# Patient Record
Sex: Female | Born: 1940 | Race: Black or African American | Hispanic: No | Smoking: Never smoker
Health system: Southern US, Community
[De-identification: ages and names within clinical notes are randomized; demographics above are authoritative.]

## PROBLEM LIST (undated history)

## (undated) DIAGNOSIS — M1990 Unspecified osteoarthritis, unspecified site: Secondary | ICD-10-CM

---

## 2020-05-14 ENCOUNTER — Encounter (HOSPITAL_COMMUNITY): Payer: Self-pay | Admitting: Emergency Medicine

## 2020-05-14 ENCOUNTER — Observation Stay (HOSPITAL_COMMUNITY)
Admission: EM | Admit: 2020-05-14 | Discharge: 2020-05-15 | Disposition: A | Discharge status: Home / Self Care | Payer: Medicare Other | Attending: Internal Medicine | Admitting: Internal Medicine

## 2020-05-14 ENCOUNTER — Other Ambulatory Visit: Payer: Self-pay

## 2020-05-14 ENCOUNTER — Emergency Department (HOSPITAL_COMMUNITY): Payer: Medicare Other

## 2020-05-14 DIAGNOSIS — N1831 Chronic kidney disease, stage 3a: Secondary | ICD-10-CM | POA: Diagnosis not present

## 2020-05-14 DIAGNOSIS — I5032 Chronic diastolic (congestive) heart failure: Secondary | ICD-10-CM | POA: Diagnosis not present

## 2020-05-14 DIAGNOSIS — F419 Anxiety disorder, unspecified: Secondary | ICD-10-CM

## 2020-05-14 DIAGNOSIS — Z7982 Long term (current) use of aspirin: Secondary | ICD-10-CM | POA: Diagnosis not present

## 2020-05-14 DIAGNOSIS — R0789 Other chest pain: Secondary | ICD-10-CM | POA: Diagnosis not present

## 2020-05-14 DIAGNOSIS — Z20822 Contact with and (suspected) exposure to covid-19: Secondary | ICD-10-CM | POA: Diagnosis not present

## 2020-05-14 DIAGNOSIS — R9431 Abnormal electrocardiogram [ECG] [EKG]: Secondary | ICD-10-CM

## 2020-05-14 DIAGNOSIS — R2242 Localized swelling, mass and lump, left lower limb: Secondary | ICD-10-CM | POA: Insufficient documentation

## 2020-05-14 DIAGNOSIS — Z79899 Other long term (current) drug therapy: Secondary | ICD-10-CM | POA: Diagnosis not present

## 2020-05-14 DIAGNOSIS — N179 Acute kidney failure, unspecified: Secondary | ICD-10-CM

## 2020-05-14 DIAGNOSIS — I130 Hypertensive heart and chronic kidney disease with heart failure and stage 1 through stage 4 chronic kidney disease, or unspecified chronic kidney disease: Secondary | ICD-10-CM | POA: Insufficient documentation

## 2020-05-14 DIAGNOSIS — F32A Depression, unspecified: Secondary | ICD-10-CM

## 2020-05-14 DIAGNOSIS — I214 Non-ST elevation (NSTEMI) myocardial infarction: Secondary | ICD-10-CM | POA: Diagnosis not present

## 2020-05-14 DIAGNOSIS — E1169 Type 2 diabetes mellitus with other specified complication: Secondary | ICD-10-CM

## 2020-05-14 DIAGNOSIS — Z853 Personal history of malignant neoplasm of breast: Secondary | ICD-10-CM

## 2020-05-14 DIAGNOSIS — E119 Type 2 diabetes mellitus without complications: Secondary | ICD-10-CM | POA: Insufficient documentation

## 2020-05-14 DIAGNOSIS — M7989 Other specified soft tissue disorders: Secondary | ICD-10-CM

## 2020-05-14 DIAGNOSIS — R7989 Other specified abnormal findings of blood chemistry: Secondary | ICD-10-CM

## 2020-05-14 DIAGNOSIS — J680 Bronchitis and pneumonitis due to chemicals, gases, fumes and vapors: Secondary | ICD-10-CM

## 2020-05-14 DIAGNOSIS — J4520 Mild intermittent asthma, uncomplicated: Secondary | ICD-10-CM

## 2020-05-14 DIAGNOSIS — F5104 Psychophysiologic insomnia: Secondary | ICD-10-CM

## 2020-05-14 DIAGNOSIS — E785 Hyperlipidemia, unspecified: Secondary | ICD-10-CM

## 2020-05-14 DIAGNOSIS — IMO0002 Reserved for concepts with insufficient information to code with codable children: Secondary | ICD-10-CM

## 2020-05-14 DIAGNOSIS — J45909 Unspecified asthma, uncomplicated: Secondary | ICD-10-CM

## 2020-05-14 DIAGNOSIS — I429 Cardiomyopathy, unspecified: Secondary | ICD-10-CM

## 2020-05-14 DIAGNOSIS — I10 Essential (primary) hypertension: Secondary | ICD-10-CM

## 2020-05-14 DIAGNOSIS — R0902 Hypoxemia: Secondary | ICD-10-CM

## 2020-05-14 DIAGNOSIS — R0602 Shortness of breath: Secondary | ICD-10-CM | POA: Diagnosis present

## 2020-05-14 DIAGNOSIS — R778 Other specified abnormalities of plasma proteins: Secondary | ICD-10-CM

## 2020-05-14 DIAGNOSIS — G4733 Obstructive sleep apnea (adult) (pediatric): Secondary | ICD-10-CM

## 2020-05-14 HISTORY — DX: Unspecified osteoarthritis, unspecified site: M19.90

## 2020-05-14 LAB — COMPREHENSIVE METABOLIC PANEL
ALT: 20 U/L (ref 0–44)
AST: 23 U/L (ref 15–41)
Albumin: 3.9 g/dL (ref 3.5–5.0)
Alkaline Phosphatase: 50 U/L (ref 38–126)
Anion gap: 8 (ref 5–15)
BUN: 18 mg/dL (ref 8–23)
CO2: 27 mmol/L (ref 22–32)
Calcium: 9.5 mg/dL (ref 8.9–10.3)
Chloride: 103 mmol/L (ref 98–111)
Creatinine, Ser: 1.36 mg/dL — ABNORMAL HIGH (ref 0.44–1.00)
GFR, Estimated: 39 mL/min — ABNORMAL LOW (ref >60)
Glucose, Bld: 108 mg/dL — ABNORMAL HIGH (ref 70–99)
Potassium: 4.4 mmol/L (ref 3.5–5.1)
Sodium: 138 mmol/L (ref 135–145)
Total Bilirubin: 0.4 mg/dL (ref 0.3–1.2)
Total Protein: 8.1 g/dL (ref 6.5–8.1)

## 2020-05-14 LAB — TROPONIN I (HIGH SENSITIVITY)
Troponin I (High Sensitivity): 50 ng/L — ABNORMAL HIGH (ref <18)
Troponin I (High Sensitivity): 49 ng/L — ABNORMAL HIGH (ref <18)
Troponin I (High Sensitivity): 47 ng/L — ABNORMAL HIGH (ref <18)

## 2020-05-14 LAB — CBC WITH DIFFERENTIAL/PLATELET
Abs Immature Granulocytes: 0.02 10*3/uL (ref 0.00–0.07)
Basophils Absolute: 0 10*3/uL (ref 0.0–0.1)
Basophils Relative: 1 %
Eosinophils Absolute: 0.1 10*3/uL (ref 0.0–0.5)
Eosinophils Relative: 1 %
HCT: 41.6 % (ref 36.0–46.0)
Hemoglobin: 13.1 g/dL (ref 12.0–15.0)
Immature Granulocytes: 0 %
Lymphocytes Relative: 37 %
Lymphs Abs: 2.7 10*3/uL (ref 0.7–4.0)
MCH: 30.6 pg (ref 26.0–34.0)
MCHC: 31.5 g/dL (ref 30.0–36.0)
MCV: 97.2 fL (ref 80.0–100.0)
Monocytes Absolute: 0.7 10*3/uL (ref 0.1–1.0)
Monocytes Relative: 10 %
Neutro Abs: 3.9 10*3/uL (ref 1.7–7.7)
Neutrophils Relative %: 51 %
Platelets: 214 10*3/uL (ref 150–400)
RBC: 4.28 MIL/uL (ref 3.87–5.11)
RDW: 12.8 % (ref 11.5–15.5)
WBC: 7.5 10*3/uL (ref 4.0–10.5)
nRBC: 0 % (ref 0.0–0.2)

## 2020-05-14 LAB — RESPIRATORY PANEL BY RT PCR (FLU A&B, COVID)
Influenza A by PCR: NEGATIVE
Influenza B by PCR: NEGATIVE
SARS Coronavirus 2 by RT PCR: NEGATIVE

## 2020-05-14 MED ORDER — PANTOPRAZOLE SODIUM 40 MG PO TBEC
40.0000 mg | DELAYED_RELEASE_TABLET | Freq: Every day | ORAL | Status: DC
  Administered 2020-05-15: 40 mg via ORAL
  Filled 2020-05-14: qty 1

## 2020-05-14 MED ORDER — IPRATROPIUM-ALBUTEROL 0.5-2.5 (3) MG/3ML IN SOLN: 3.0000 mL | RESPIRATORY_TRACT | Status: DC | PRN

## 2020-05-14 MED ORDER — CARVEDILOL 3.125 MG PO TABS
3.1250 mg | ORAL_TABLET | Freq: Two times a day (BID) | ORAL | Status: DC
  Administered 2020-05-15: 3.125 mg via ORAL
  Filled 2020-05-14: qty 1

## 2020-05-14 MED ORDER — LISINOPRIL 5 MG PO TABS
5.0000 mg | ORAL_TABLET | Freq: Every day | ORAL | Status: DC
  Administered 2020-05-14 – 2020-05-15 (×2): 5 mg via ORAL
  Filled 2020-05-14: qty 1

## 2020-05-14 MED ORDER — DM-GUAIFENESIN ER 30-600 MG PO TB12
1.0000 | ORAL_TABLET | Freq: Two times a day (BID) | ORAL | Status: DC
  Administered 2020-05-14 – 2020-05-15 (×2): 1 via ORAL
  Filled 2020-05-14 (×2): qty 1

## 2020-05-14 MED ORDER — SODIUM CHLORIDE 0.9 % IV BOLUS
1000.0000 mL | Freq: Once | INTRAVENOUS | Status: AC
  Administered 2020-05-14: 1000 mL via INTRAVENOUS

## 2020-05-14 MED ORDER — ALBUTEROL SULFATE HFA 108 (90 BASE) MCG/ACT IN AERS
2.0000 | INHALATION_SPRAY | Freq: Once | RESPIRATORY_TRACT | Status: AC
  Administered 2020-05-14: 2 via RESPIRATORY_TRACT
  Filled 2020-05-14: qty 6.7

## 2020-05-14 MED ORDER — HEPARIN SODIUM (PORCINE) 5000 UNIT/ML IJ SOLN
5000.0000 [IU] | Freq: Three times a day (TID) | INTRAMUSCULAR | Status: DC
  Administered 2020-05-14 – 2020-05-15 (×2): 5000 [IU] via SUBCUTANEOUS
  Filled 2020-05-14 (×3): qty 1

## 2020-05-14 MED ORDER — ATORVASTATIN CALCIUM 10 MG PO TABS
10.0000 mg | ORAL_TABLET | Freq: Every day | ORAL | Status: DC
  Administered 2020-05-14 – 2020-05-15 (×2): 10 mg via ORAL
  Filled 2020-05-14: qty 1

## 2020-05-14 MED ORDER — VENLAFAXINE HCL ER 75 MG PO CP24
75.0000 mg | ORAL_CAPSULE | Freq: Every day | ORAL | Status: DC
  Administered 2020-05-14 – 2020-05-15 (×2): 75 mg via ORAL
  Filled 2020-05-14: qty 1

## 2020-05-14 MED ORDER — ASPIRIN 81 MG PO CHEW
81.0000 mg | CHEWABLE_TABLET | Freq: Every day | ORAL | Status: DC
  Administered 2020-05-15: 81 mg via ORAL
  Filled 2020-05-14: qty 1

## 2020-05-14 MED ORDER — LORAZEPAM 0.5 MG PO TABS: 0.5000 mg | ORAL_TABLET | Freq: Once | ORAL | Status: DC

## 2020-05-14 MED ORDER — LORAZEPAM 2 MG/ML IJ SOLN
0.5000 mg | Freq: Once | INTRAMUSCULAR | Status: DC
  Filled 2020-05-14: qty 1

## 2020-05-14 MED ORDER — SPIRONOLACTONE 25 MG PO TABS
25.0000 mg | ORAL_TABLET | Freq: Two times a day (BID) | ORAL | Status: DC
  Administered 2020-05-14 – 2020-05-15 (×2): 25 mg via ORAL
  Filled 2020-05-14 (×6): qty 1

## 2020-05-14 MED ORDER — ALBUTEROL SULFATE HFA 108 (90 BASE) MCG/ACT IN AERS: 2.0000 | INHALATION_SPRAY | Freq: Every day | RESPIRATORY_TRACT | Status: DC | PRN

## 2020-05-14 MED ORDER — MELATONIN 5 MG PO TABS
5.0000 mg | ORAL_TABLET | Freq: Once | ORAL | Status: DC
  Filled 2020-05-14: qty 1

## 2020-05-14 MED ORDER — AEROCHAMBER PLUS FLO-VU SMALL MISC
1.0000 | Freq: Once | Status: AC
  Administered 2020-05-14: 1

## 2020-05-14 MED ORDER — LORAZEPAM 2 MG/ML IJ SOLN
0.5000 mg | Freq: Once | INTRAMUSCULAR | Status: AC
  Administered 2020-05-14: 0.5 mg via INTRAMUSCULAR

## 2020-05-14 NOTE — ED Notes (Signed)
Son called   States his mother has this rxn at funerals and feels he should come and get her   Call transferred to [place]

## 2020-05-14 NOTE — ED Notes (Signed)
Report to Tiffany, RN 

## 2020-05-14 NOTE — ED Provider Notes (Signed)
[facility] EMERGENCY DEPARTMENT Provider Note   CSN: [id number] Arrival date & time: 05/14/20  1350    History Chief Complaint  Patient presents with  . Shortness of Breath   Ashley Farrell is a 79 y.o. female who presents from the funeral home via EMS with concern for shortness of breath, hoarseness of voice.  In the interview the patient she is extremely emotional, crying, think she has to get home her sick daughter.  She reports that this was a funeral for her sister, her brother died last month, her daughter is at home with brain cancer.  At the time of my interview the patient is primarily whispering, mouthing her words, reports she is unable to vocalize anything more than a whisper, due to tightness in her throat.  She reports she felt began feeling short of breath at the funeral, a relative allowed her to utilize her oxygen.  EMS was called.  Ashley Farrell reports that this happens occasionally to her she has "episodes" where she becomes short of breath, goes hoarse in her voice, she feels very anxious, she attributes this to exposure to perfumes /scents.  Collateral information was obtained from son Ashley Farrell who called into the emergency department.  He states that she has had multiple episodes like this at funeral homes, where she becomes extremely emotional progresses to difficulty breathing.  He states that they tried to convince the female home staff not to call EMS at this time, as they are visiting here from [facility] for the funeral today.  He states that she becomes extremely anxious, but as soon as she begins to calm down emotionally her respiratory symptoms resolved.  I personally reviewed the patient's medical records.  She has history of hyperlipidemia, hypertension, no other issues listed in our record, the patient is from [place].  Patient is also on Lasix, spironolactone.  She endorses history of cardiac issues, however she is unable to provide any further insight into her  medical history at the time of my initial interview. She is emotional and crying, repeating that she needs to go home, she has to get out of here, stating she cannot stay in the emergency department, as she needs to get home to her sick daughter.   At the end of my interview the patient suddenly began speaking in a normal tone of voice, would intermittently speak back-and-forth between the whispers speaking and her normal tone of voice.  Collateral information obtained from patient's son Ashley Farrell, who voices this patient has had similar episodes of funerals in the past.  He he states she "becomes overcome with emotion, overreacts" and experiences shortness of breath, wheezing.  He reports extensive cardiac history, patient is established with multiple specialists in [place], [place].  HPI     Past Medical History:  Diagnosis Date  . Arthritis     There are no problems to display for this patient.   History reviewed. No pertinent surgical history.   OB History   No obstetric history on file.     History reviewed. No pertinent family history.  Social History   Tobacco Use  . Smoking status: Never Smoker  . Smokeless tobacco: Never Used  Substance Use Topics  . Alcohol use: Not on file  . Drug use: Not on file    Home Medications Prior to Admission medications   Medication Sig Start Date End Date Taking? Authorizing Provider  albuterol (VENTOLIN HFA) 108 (90 Base) MCG/ACT inhaler Inhale 2 puffs into the lungs daily as  needed for wheezing or shortness of breath.    Yes [provider]  aspirin 81 MG chewable tablet Chew by mouth. 01/21/07  Yes [provider]  atorvastatin (LIPITOR) 10 MG tablet Take by mouth.   Yes [provider]  carvedilol (COREG) 3.125 MG tablet Take by mouth.   Yes [provider]  Cholecalciferol 50 MCG (2000 UT) CAPS Take by mouth.   Yes [provider]  furosemide (LASIX) 80 MG tablet Take by mouth.    Yes [provider]  gabapentin (NEURONTIN) 300 MG capsule Take by mouth.   Yes [provider]  ibuprofen (ADVIL) 200 MG tablet Take 200 mg by mouth every 6 (six) hours as needed.   Yes [provider]  lisinopril (ZESTRIL) 5 MG tablet Take by mouth.   Yes [provider]  meclizine (ANTIVERT) 25 MG tablet Take by mouth. 04/28/20  Yes [provider]  pantoprazole (PROTONIX) 40 MG tablet Take by mouth. 01/30/17  Yes [provider]  potassium chloride SA (KLOR-CON M20) 20 MEQ tablet Take by mouth. 01/30/17  Yes [provider]  pregabalin (LYRICA) 150 MG capsule Take 150 mg by mouth 2 (two) times daily. 11/24/19  Yes [provider]  spironolactone (ALDACTONE) 25 MG tablet Take 25 mg by mouth 2 (two) times daily. 04/25/20  Yes [provider]  sulindac (CLINORIL) 200 MG tablet  10/08/17  Yes [provider]  venlafaxine XR (EFFEXOR-XR) 75 MG 24 hr capsule Take 75 mg by mouth daily. 04/28/20  Yes [provider]    Allergies    Patient has no allergy information on record.  Review of Systems   Review of Systems  Constitutional: Negative for activity change, appetite change, chills, fatigue and fever.  HENT: Positive for voice change. Negative for trouble swallowing.        Tightness in her throat  Respiratory: Positive for chest tightness, shortness of breath and wheezing. Negative for cough.   Cardiovascular: Negative for chest pain, palpitations and leg swelling.  Gastrointestinal: Negative for abdominal pain, nausea and vomiting.  Endocrine: Negative.   Genitourinary: Negative.   Musculoskeletal: Negative.   Skin: Negative.   Allergic/Immunologic: Negative.   Neurological: Negative for dizziness, syncope, light-headedness and headaches.  Psychiatric/Behavioral: The patient is nervous/anxious.     Physical Exam Updated Vital Signs BP (!) 143/83   Pulse (!) 59   Temp 97.7 F (36.5 C)  (Oral)   Resp 16   Ht 5\' 3"  (1.6 m)   Wt 97.5 kg   SpO2 100%   BMI 38.09 kg/m   Physical Exam Vitals and nursing note reviewed.  HENT:     Head: Normocephalic and atraumatic.     Mouth/Throat:     Mouth: Mucous membranes are moist. No angioedema.     Tongue: Tongue does not deviate from midline.     Pharynx: Uvula midline. No oropharyngeal exudate, posterior oropharyngeal erythema or uvula swelling.     Tonsils: No tonsillar exudate.  Eyes:     General:        Right eye: No discharge.        Left eye: No discharge.     Conjunctiva/sclera: Conjunctivae normal.  Neck:     Thyroid: No thyroid mass.     Trachea: Trachea normal. No tracheal tenderness or tracheal deviation.  Cardiovascular:     Rate and Rhythm: Normal rate and regular rhythm.     Pulses: Normal pulses.  Radial pulses are 2+ on the right side and 2+ on the left side.     Heart sounds: Normal heart sounds. No murmur heard.   Pulmonary:     Effort: Pulmonary effort is normal. No respiratory distress.     Breath sounds: Examination of the right-middle field reveals wheezing. Examination of the left-middle field reveals wheezing. Examination of the left-lower field reveals wheezing. Wheezing present. No rales.  Abdominal:     General: Bowel sounds are normal. There is no distension.     Tenderness: There is no abdominal tenderness.  Musculoskeletal:        General: No deformity.     Cervical back: Neck supple.     Right lower leg: No edema.     Left lower leg: No edema.  Lymphadenopathy:     Cervical: No cervical adenopathy.  Skin:    General: Skin is warm and dry.  Neurological:     Mental Status: She is alert. Mental status is at baseline.  Psychiatric:        Mood and Affect: Mood normal.     ED Results / Procedures / Treatments   Labs (all labs ordered are listed, but only abnormal results are displayed) Labs Reviewed  COMPREHENSIVE METABOLIC PANEL - Abnormal; Notable for the following  components:      Result Value   Glucose, Bld 108 (*)    Creatinine, Ser 1.36 (*)    GFR, Estimated 39 (*)    All other components within normal limits  TROPONIN I (HIGH SENSITIVITY) - Abnormal; Notable for the following components:   Troponin I (High Sensitivity) 50 (*)    All other components within normal limits  TROPONIN I (HIGH SENSITIVITY) - Abnormal; Notable for the following components:   Troponin I (High Sensitivity) 49 (*)    All other components within normal limits  RESPIRATORY PANEL BY RT PCR (FLU A&B, COVID)  CBC WITH DIFFERENTIAL/PLATELET  URINALYSIS, COMPLETE (UACMP) WITH MICROSCOPIC    EKG EKG Interpretation  Date/Time:  Saturday May 14 2020 14:00:24 EDT Ventricular Rate:  78 PR Interval:    QRS Duration: 88 QT Interval:  354 QTC Calculation: 404 R Axis:   -14 Text Interpretation: Sinus rhythm Anterior infarct, old Nonspecific T abnormalities, lateral leads No STEMI Confirmed by Alona Bene [phone]) on 05/14/2020 2:09:42 PM   Radiology DG Chest Portable 1 View  Result Date: 05/14/2020 CLINICAL DATA:  Dyspnea EXAM: PORTABLE CHEST 1 VIEW COMPARISON:  None. FINDINGS: Two lead right subclavian ICD with lead tips overlying the right atrium and right ventricular apex. Mild cardiomegaly no pneumothorax. No pleural effusion. No overt pulmonary edema. No consolidative airspace disease. Surgical clips overlie the left lateral chest wall/left breast. IMPRESSION: Mild cardiomegaly without overt pulmonary edema. No active pulmonary disease. Electronically Signed   By: Delbert Phenix M.D.   On: 05/14/2020 15:20    Procedures Procedures (including critical care time)  Medications Ordered in ED Medications  albuterol (VENTOLIN HFA) 108 (90 Base) MCG/ACT inhaler 2 puff (2 puffs Inhalation Given 05/14/20 1447)  AeroChamber Plus Flo-Vu Small device MISC 1 each (1 each Other Given 05/14/20 1448)  LORazepam (ATIVAN) injection 0.5 mg (0.5 mg Intramuscular Given 05/14/20 1458)    sodium chloride 0.9 % bolus 1,000 mL (1,000 mLs Intravenous New Bag/Given 05/14/20 1723)    ED Course  I have reviewed the triage vital signs and the nursing notes.  Pertinent labs & imaging results that were available during my care of the patient were reviewed by  me and considered in my medical decision making (see chart for details).    MDM Rules/Calculators/A&P                         79 year old patient who presents from a family member's funeral with concern for shortness of breath, wheezing, hoarseness of voice. Patient had similar episodes in the past.  Differential diagnosis for the patient's symptoms include to NSTEMI, PE, pulmonary edema, pneumonia, reactive airway, dysrhythmia, anxiety.    Patient normotensive, normal heart rate on intake.  Oxygen saturations 100% on room air.  Physical exam significant for wheezing bilaterally, patient able to phonate normally when she chooses to.  She is tearful, agitated when I am in the room, stating that she cannot stay here and she needs to go home.  EKG with normal sinus rhythm, diffuse T wave inversions, no STEMI.  Chest x-ray with cardiomegaly without overt pulmonary edema.  No active pulmonary disease.  CBC, CMP, troponin, UA pending.  Albuterol ordered for wheezing, Ativan for anxiety.  CBC unremarkable, CMP with AKI, creatinine elevated 1.36.  Will administer fluids for AKI.  Initial troponin elevated to 50.  Unsure patient's baseline troponin level is, as records are out of our system.  At the time of my reevaluation of the patient she is resting calmly in her room.  Vital signs have remained stable on monitor in her room.  Wheezing is resolved following albuterol administration. She is mildly hypertensive 143/83.    I discussed with her the findings of abnormal EKG, elevated troponin.  Plan to repeat troponin, with likely admission to the hospital.  Delta troponin 49, stable but still elevated.  Respiratory pathogen  panel pending, UA to be collected.  Discussed plan for admission with Ashley Farrell and her son Ashley Farrell who is now at the bedside.  Explained that findings of abnormal EKG and elevated troponin concerning for NSTEMI, and that it is important that she remain in the hospital for close observation and management of her acute kidney injury.   Patient is agreeable to admission to the hospital for observation.  Consult placed to hospitalist, [facility], who is requesting cardiology consult prior to establishing disposition.  Appreciate hiswillingness to collaborate in the care of this patient.  Cardiology consult placed at time of shift change.  Attending physician Dr. Estell Harpin agreeable to discussing treatment plan for this patient with cardiology team to determine disposition for this patient, then communicating this plan with admitting hospitalist.  I appreciate his collaboration care of this patient.  Final Clinical Impression(s) / ED Diagnoses Final diagnoses:  None    Rx / DC Orders ED Discharge Orders    None       Sherrilee Gilles 05/14/20 2010    Maia Plan, MD 05/15/20 [phone]

## 2020-05-14 NOTE — H&P (Signed)
History and Physical  Ashley Farrell HTD:[id number] DOB: Feb 03, 1941 DOA: 05/14/2020  Referring physician: Emeline Darling, PA-C PCP: Pcp, No  Patient coming from: Home  Chief Complaint: Shortness of breath  HPI: Ashley Farrell is an 79 y.o. 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  Patient states that she went to the funeral ceremony of her sister, while still at the funeral, she started to feel short of breath.  Patient states that she is very sensitive to odors and perfumes which usually leads to shortness of breath, chest tightness and sensation of tightness in her throat.   Per ED medical record, collateral information was obtained from son who states that patient has had similar multiple episodes where she becomes very emotional epineural arms and it progresses to difficulty breathing. Patient states that she has not been sleeping much lately due to going through a whole lot of stress, she states that her son-in-law died at age [age]last year of coronavirus. 154year old [place] brother also died of cancer recently. She has a sister who has severe terminal cancer of the lung metastasized to her brain. Her daughter also has lung cancer that has metastasized to her brain.  She denies chest pain, fever, chills, nausea, vomiting or abdominal pain.  ED Course:  In the emergency department, she was hemodynamically stable, O2 sat was 94-100% on room air.  Work-up in the  ED showed normal CBC and BMP except for elevated creatinine at 1.36 (no prior labs for comparison) high-sensitivity troponin I was 50> 49.  Respiratory panel for influenza A, B and SARS coronavirus 2 was negative.  Chest x-ray showed mild cardiomegaly without overt pulmonary edema.  No active pulmonary disease noted.  EKG shows sinus rhythm at a rate of 77 bpm and ST depression in V5-V6 and T wave inversion in leads III and aVF (no prior EKG for comparison).  [facility] and [facility] and trend enzymes with plan to discharge patient home if she is symptom-free and [place]  Hospitalist was asked to admit patient for further evaluation and management.  Review of Systems: Constitutional: Negative for chills and fever.  HENT: Negative for ear pain and sore throat.   Eyes: Negative for pain and visual disturbance.  Respiratory: Positive for shortness of breath and chest tightness.  Negative for cough Cardiovascular: Negative for chest pain and palpitations.  Gastrointestinal: Negative for abdominal pain and vomiting.  Endocrine: Negative for polyphagia and polyuria.  Genitourinary: Negative for decreased urine volume, dysuria, enuresis Musculoskeletal: Positive for left mastectomy.  Negative for arthralgias and back pain.  Skin: Negative for color change and rash.  Allergic/Immunologic: Negative for immunocompromised state.  Neurological: Negative for tremors, syncope, speech difficulty, weakness, light-headedness and headaches.  Hematological: Does not bruise/bleed easily.  All other systems reviewed and are negative   Past Medical History:  Diagnosis Date  . Arthritis    History reviewed. No pertinent surgical history.  Social History:  reports that she has never smoked. She has never used  smokeless tobacco. No history on file for alcohol use and drug use.   Not on  File  History reviewed. No pertinent family history.    Prior to Admission medications   Medication Sig Start Date End Date Taking? Authorizing Provider  albuterol (VENTOLIN HFA) 108 (90 Base) MCG/ACT inhaler Inhale 2 puffs into the lungs daily as needed for wheezing or shortness of breath.    Yes [provider]  aspirin 81 MG chewable tablet Chew by mouth. 01/21/07  Yes [provider]  atorvastatin (LIPITOR) 10 MG tablet Take by mouth.   Yes [provider]  carvedilol (COREG) 3.125 MG tablet Take by mouth.   Yes [provider]  Cholecalciferol 50 MCG (2000 UT) CAPS Take by mouth.   Yes [provider]  furosemide (LASIX) 80 MG tablet Take by mouth.   Yes [provider]  gabapentin (NEURONTIN) 300 MG capsule Take by mouth.   Yes [provider]  ibuprofen (ADVIL) 200 MG tablet Take 200 mg by mouth every 6 (six) hours as needed.   Yes [provider]  lisinopril (ZESTRIL) 5 MG tablet Take by mouth.   Yes [provider]  meclizine (ANTIVERT) 25 MG tablet Take by mouth. 04/28/20  Yes [provider]  pantoprazole (PROTONIX) 40 MG tablet Take by mouth. 01/30/17  Yes [provider]  potassium chloride SA (KLOR-CON M20) 20 MEQ tablet Take by mouth. 01/30/17  Yes [provider]  pregabalin (LYRICA) 150 MG capsule Take 150 mg by mouth 2 (two) times daily. 11/24/19  Yes [provider]  spironolactone (ALDACTONE) 25 MG tablet Take 25 mg by mouth 2 (two) times daily. 04/25/20  Yes [provider]  sulindac (CLINORIL) 200 MG tablet  10/08/17  Yes [provider]  venlafaxine XR (EFFEXOR-XR) 75 MG 24 hr capsule Take 75 mg by mouth daily. 04/28/20  Yes [provider]    Physical Exam: BP (!) 142/64   Pulse (!) 58   Temp (!) 97.2 F (36.2 C) (Oral)   Resp 19   Ht '5\' 3"'  (1.6 m)   Wt 97.5 kg   SpO2 99%   BMI 38.09 kg/m   . General: 79 y.o. year-old  female well developed well nourished in no acute distress.  Alert and oriented x3. Marland Kitchen HEENT: NCAT, EOMI . Neck: Supple, trachea medial . Cardiovascular: AICD insertion site noted on right side of chest.  Regular rate and rhythm with no rubs or gallops.  No thyromegaly or JVD noted. 2/4 pulses in all 4 extremities. Marland Kitchen Respiratory: Clear to auscultation with no wheezes or rales. Good inspiratory effort. . Abdomen: Soft nontender nondistended with normal bowel sounds x4 quadrants. . Muskuloskeletal: LLE swelling (compared to RLE).  No cyanosis or clubbing noted bilaterally . Neuro: CN II-XII intact, strength, sensation, reflexes . Skin: No ulcerative lesions noted or rashes . Psychiatry: Judgement and insight appear normal. Mood is appropriate for condition and setting          Labs on Admission:  Basic Metabolic Panel: Recent Labs  Lab 05/14/20 1507  NA 138  K 4.4  CL 103  CO2 27  GLUCOSE 108*  BUN 18  CREATININE 1.36*  CALCIUM 9.5   Liver Function Tests: Recent Labs  Lab 05/14/20 1507  AST 23  ALT 20  ALKPHOS 50  BILITOT 0.4  PROT 8.1  ALBUMIN 3.9   No results for input(s): LIPASE, AMYLASE in the last 168 hours. No results for input(s): AMMONIA in  the last 168 hours. CBC: Recent Labs  Lab 05/14/20 1507  WBC 7.5  NEUTROABS 3.9  HGB 13.1  HCT 41.6  MCV 97.2  PLT 214   Cardiac Enzymes: No results for input(s): CKTOTAL, CKMB, CKMBINDEX, TROPONINI in the last 168 hours.  BNP (last 3 results) No results for input(s): BNP in the last 8760 hours.  ProBNP (last 3 results) No results for input(s): PROBNP in the last 8760 hours.  CBG: No results for input(s): GLUCAP in the last 168 hours.  Radiological Exams on Admission: DG Chest Portable 1 View  Result Date: 05/14/2020 CLINICAL DATA:  Dyspnea EXAM: PORTABLE CHEST 1 VIEW COMPARISON:  None. FINDINGS: Two lead right subclavian ICD with lead tips overlying the right atrium and right ventricular apex. Mild  cardiomegaly no pneumothorax. No pleural effusion. No overt pulmonary edema. No consolidative airspace disease. Surgical clips overlie the left lateral chest wall/left breast. IMPRESSION: Mild cardiomegaly without overt pulmonary edema. No active pulmonary disease. Electronically Signed   By: Ilona Sorrel M.D.   On: 05/14/2020 15:20    EKG: I independently viewed the EKG done and my findings are as followed:  EKG shows sinus rhythm at a rate of 77 bpm and ST depression in V5-V6 and T wave inversion in leads III and aVF   Assessment/Plan Present on Admission: . NSTEMI (non-ST elevated myocardial infarction) ([place])  Principal Problem:   NSTEMI (non-ST elevated myocardial infarction) ([place]) Active Problems:   Elevated troponin I level   Abnormal EKG   Creatinine elevation   Localized swelling of left lower leg   Bronchitis due to fumes and vapors (HCC)   Essential hypertension   Type 2 diabetes mellitus with hyperlipidemia (HCC)   Type 2 diabetes mellitus ([place])   Obstructive sleep apnea   History of left breast cancer   Anxiety and depression   Psychophysiological insomnia   Asthma   Cardiomyopathy ([place])  Elevated troponin with EKG changes r/o NSTEMI Patient presents with troponin I 50> 49  EKG personally reviewed shows sinus rhythm at a rate of 77 bpm and ST depression in V5-V6 and T wave inversion in leads III and aVF (no prior EKG for comparison) Patient will be admitted to telemetry unit Patient denies chest pain, Continue to trend troponin Cardiology at [facility] was consulted and recommended trending troponins overnight with plan to discharge patient in the morning if stable and to follow-up with cardiology when she returns home at [facility].  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 Breathing treatment was provided in the ED, continue Ventolin inhaler Continue DuoNebs every 4 hours as needed for wheezing Continue Mucinex, incentive telemetry  Elevated  creatinine with no known history of CKD (?AKI) Creatinine at 1.36 (no prior labs for comparison) IV hydration with water in the ED Renally adjust medications, avoid nephrotoxic agents/dehydration/hypotension  Left leg swelling LLE > RLE Ultrasound will be done in the morning to rule out DVT  Obstructive sleep apnea on CPAP Continue CPAP  Cardiomyopathy Patient has AICD Continue aspirin, Lipitor, Coreg, lisinopril Lasix temporarily held due to elevated creatinine level with suspicion for AKI  Essential hypertension Continue Coreg, lisinopril  Hyperlipidemia Continue Lipitor  Type 2 diabetes mellitus Continue insulin sliding scale and hypoglycemic protocol  Psychophysiological insomnia Anxiety and depression Effexor was given in the ED, continue Effexor Ativan was given in the ED, continue melatonin   DVT prophylaxis: Heparin subcu  Code Status: Full code  Family Communication: None at bedside  Disposition Plan:  Patient  is from:                        home Anticipated DC to:                   home Anticipated DC date:               1 day Anticipated DC barriers:          Patient is unstable to be discharged at this time due to elevated troponin and EKG changes with suspicion for NSTEMI requiring further trending of cardiac enzymes and management of acute bronchitis with wheezing and shortness of breath    Consults called: Cardiology (by ED team)  Admission status: Observation   Bernadette Hoit MD [facility]  05/14/2020, 9:35 PM

## 2020-05-14 NOTE — ED Triage Notes (Signed)
Pt was at a funeral. Became sob. Family member gave pt oxygen at funeral  VSS. 100 % on room air.

## 2020-05-14 NOTE — ED Notes (Signed)
Pt is currently sleeping so temp not obtained   She has shared previously that she was her for her younger sister's funeral- "she was more my baby, I raised her" she also reports her daughter is ill.

## 2020-05-14 NOTE — ED Provider Notes (Signed)
I spoke with cardiology and it was felt the patient could be admitted to [facility] and have her enzymes cycled.  If tomorrow she is symptom-free and her enzymes have been stable she can be discharged home with follow-up with her cardiologist in [place], Jomarie Longs, MD 05/14/20 2039

## 2020-05-14 NOTE — ED Notes (Signed)
PA in to speak with son [name] [phone] and patient

## 2020-05-14 NOTE — ED Notes (Signed)
Here from [facility] for sister's funeral  Became per her report short of breath as she sometimes does at funerals   Was given oxygen from another person there   LS clear  NAD

## 2020-05-15 ENCOUNTER — Observation Stay (HOSPITAL_COMMUNITY): Payer: Medicare Other

## 2020-05-15 DIAGNOSIS — R778 Other specified abnormalities of plasma proteins: Secondary | ICD-10-CM

## 2020-05-15 DIAGNOSIS — R0789 Other chest pain: Secondary | ICD-10-CM | POA: Diagnosis not present

## 2020-05-15 DIAGNOSIS — M7989 Other specified soft tissue disorders: Secondary | ICD-10-CM

## 2020-05-15 DIAGNOSIS — I10 Essential (primary) hypertension: Secondary | ICD-10-CM

## 2020-05-15 DIAGNOSIS — F419 Anxiety disorder, unspecified: Secondary | ICD-10-CM

## 2020-05-15 DIAGNOSIS — F32A Depression, unspecified: Secondary | ICD-10-CM

## 2020-05-15 DIAGNOSIS — J680 Bronchitis and pneumonitis due to chemicals, gases, fumes and vapors: Secondary | ICD-10-CM

## 2020-05-15 LAB — CBC
HCT: 34.7 % — ABNORMAL LOW (ref 36.0–46.0)
Hemoglobin: 10.9 g/dL — ABNORMAL LOW (ref 12.0–15.0)
MCH: 30.5 pg (ref 26.0–34.0)
MCHC: 31.4 g/dL (ref 30.0–36.0)
MCV: 97.2 fL (ref 80.0–100.0)
Platelets: 206 10*3/uL (ref 150–400)
RBC: 3.57 MIL/uL — ABNORMAL LOW (ref 3.87–5.11)
RDW: 12.6 % (ref 11.5–15.5)
WBC: 7 10*3/uL (ref 4.0–10.5)
nRBC: 0 % (ref 0.0–0.2)

## 2020-05-15 LAB — COMPREHENSIVE METABOLIC PANEL
ALT: 19 U/L (ref 0–44)
AST: 21 U/L (ref 15–41)
Albumin: 3.3 g/dL — ABNORMAL LOW (ref 3.5–5.0)
Alkaline Phosphatase: 44 U/L (ref 38–126)
Anion gap: 5 (ref 5–15)
BUN: 19 mg/dL (ref 8–23)
CO2: 25 mmol/L (ref 22–32)
Calcium: 8.7 mg/dL — ABNORMAL LOW (ref 8.9–10.3)
Chloride: 105 mmol/L (ref 98–111)
Creatinine, Ser: 1.16 mg/dL — ABNORMAL HIGH (ref 0.44–1.00)
GFR, Estimated: 48 mL/min — ABNORMAL LOW (ref >60)
Glucose, Bld: 132 mg/dL — ABNORMAL HIGH (ref 70–99)
Potassium: 4.3 mmol/L (ref 3.5–5.1)
Sodium: 135 mmol/L (ref 135–145)
Total Bilirubin: 0.5 mg/dL (ref 0.3–1.2)
Total Protein: 7 g/dL (ref 6.5–8.1)

## 2020-05-15 LAB — PROTIME-INR
INR: 1.1 (ref 0.8–1.2)
Prothrombin Time: 13.5 seconds (ref 11.4–15.2)

## 2020-05-15 LAB — TROPONIN I (HIGH SENSITIVITY): Troponin I (High Sensitivity): 44 ng/L — ABNORMAL HIGH (ref <18)

## 2020-05-15 LAB — PHOSPHORUS: Phosphorus: 3.4 mg/dL (ref 2.5–4.6)

## 2020-05-15 LAB — MAGNESIUM: Magnesium: 2 mg/dL (ref 1.7–2.4)

## 2020-05-15 LAB — APTT: aPTT: 34 seconds (ref 24–36)

## 2020-05-15 NOTE — Progress Notes (Signed)
Has denied chest pain today and [place] of lower ext was neg for dvt.  Patient ambulatory in room and sat in recliner.  IV removed and DC papers reviewed with no new med orders.  Family to give ride home

## 2020-05-15 NOTE — Discharge Summary (Signed)
Physician Discharge Summary  Ashley Farrell XTA:[id number] DOB: [birth year] DOA: 05/14/2020  PCP: Pcp, No  Admit date: 05/14/2020 Discharge date: 05/15/2020  Admitted From: Home Disposition:  Home   Recommendations for Outpatient Follow-up:  1. Follow up with PCP in 1-2 weeks 2. Please obtain BMP/CBC in one week 3. Please follow up on the following pending results:    Discharge Condition: Stable CODE STATUS: FULL Diet recommendation: Heart Healthy / Carb Modified   Brief/Interim Summary:  79 y.o. 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    Patient states that she went to the funeral ceremony of her sister, while still at the funeral, she started to feel short of breath.  Patient states that she is very sensitive to odors and perfumes which usually leads to shortness of breath, chest tightness and sensation of tightness in her throat.    Per ED medical record, collateral information was obtained from son who states that patient has had similar multiple episodes where she becomes very emotional, anxious, and it progresses to difficulty breathing. Patient states that she has not been sleeping much lately due to going through a whole lot of stress, she states that her son-in-law died at age [age]last year of coronavirus. 138year old grandson [place] Her brother also died of cancer recently. She has a sister who has severe terminal cancer of the  lung metastasized to her brain. Her daughter also has lung cancer that has metastasized to her brain.  She denies chest pain, fever, chills, nausea, vomiting or abdominal pain.  ED Course:  In the emergency department, she was hemodynamically stable, O2 sat was 94-100% on room air.  Work-up in the ED showed normal CBC and BMP except for elevated creatinine at 1.36 (no prior labs for comparison) high-sensitivity troponin I was 50> 49.  Respiratory panel for influenza A, B and SARS coronavirus 2 was negative.  Chest x-ray showed mild cardiomegaly without overt pulmonary edema.  No active pulmonary disease noted.  EKG shows sinus rhythm at a rate of 77 bpm and ST depression in V5-V6 and T wave inversion in leads III and aVF (no prior EKG for comparison).  [facility] and trend enzymes with plan to discharge patient home if she is symptom-free and [place]  Hospitalist was asked to admit patient for further evaluation and management.  Since arrival to the medical floor, the patient no longer had any further chest discomfort or shortness of breath.  Her troponins were flat.  The patient remained hemodynamically stable without any further chest discomfort.  There is no dysrhythmias noted on telemetry.  As the patient was much improved and asymptomatic, she wanted to leave the follow-up with her home cardiologist.  Discharge Diagnoses:  Atypical chest pain -Troponins have been cycled and trend is flat and unremarkable -Troponins 50>> 49>> 47>> 44 -Personally reviewed EKG -Sinus rhythm, nonspecific T wave changes -Personally reviewed chest x-ray--no edema or consolidations, AICD present  Left lower extremity edema -Venous duplex--neg for  DVT  Essential hypertension -Continue carvedilol, spironolactone, lisinopril  Psychophysiological insomnia Anxiety and depression Effexor  was given in the ED, continue Effexor Ativan was given in the ED, continue melatonin  Diabetes mellitus type 2 -Previously on Metformin -NovoLog sliding scale -CBGs controlled  Chronic diastolic CHF -Appears clinically euvolemic -02/21/2009 echo EF 55 to 60%  Cardiomyopathy, type unspecified -Status post AICD -Continue carvedilol, lisinopril, spironolactone  Hyperlipidemia -Continue Lipitor  CKD stage 3a -baseline creatinine 1.1-1.3    Discharge Instructions   Allergies as of 05/15/2020   Not on File     Medication List    TAKE these medications   albuterol 108 (90 Base) MCG/ACT inhaler Commonly known as: VENTOLIN HFA Inhale 2 puffs into the lungs daily as needed for wheezing or shortness of breath.   aspirin 81 MG chewable tablet Chew by mouth.   atorvastatin 10 MG tablet Commonly known as: LIPITOR Take by mouth.   carvedilol 3.125 MG tablet Commonly known as: COREG Take by mouth.   Cholecalciferol 50 MCG (2000 UT) Caps Take by mouth.   furosemide 80 MG tablet Commonly known as: LASIX Take by mouth.   gabapentin 300 MG capsule Commonly known as: NEURONTIN Take by mouth.   ibuprofen 200 MG tablet Commonly known as: ADVIL Take 200 mg by mouth every 6 (six) hours as needed.   Klor-Con M20 20 MEQ tablet Generic drug: potassium chloride SA Take by mouth.   lisinopril 5 MG tablet Commonly known as: ZESTRIL Take by mouth.   meclizine 25 MG tablet Commonly known as: ANTIVERT Take by mouth.   pantoprazole 40 MG tablet Commonly known as: PROTONIX Take by mouth.   pregabalin 150 MG capsule Commonly known as: LYRICA Take 150 mg by mouth 2 (two) times daily.   spironolactone 25 MG tablet Commonly known as: ALDACTONE Take 25 mg by mouth 2 (two) times daily.   sulindac 200 MG tablet Commonly known as: CLINORIL   venlafaxine XR 75 MG 24 hr capsule Commonly known as: EFFEXOR-XR Take 75 mg by mouth daily.       Not on  File  Consultations:  none   Procedures/Studies: DG Chest Portable 1 View  Result Date: 05/14/2020 CLINICAL DATA:  Dyspnea EXAM: PORTABLE CHEST 1 VIEW COMPARISON:  None. FINDINGS: Two lead right subclavian ICD with lead tips overlying the right atrium and right ventricular apex. Mild cardiomegaly no pneumothorax. No pleural effusion. No overt pulmonary edema. No consolidative airspace disease. Surgical clips overlie the left lateral chest wall/left breast. IMPRESSION: Mild cardiomegaly without overt pulmonary edema. No active pulmonary disease. Electronically Signed   By: Ilona Sorrel M.D.   On: 05/14/2020 15:20        Discharge Exam: Vitals:   05/15/20 0649 05/15/20 0845  BP: (!) 136/56   Pulse: 60   Resp: 19   Temp: 97.9 F (36.6 C)   SpO2: 99% 98%   Vitals:   05/14/20 2232 05/15/20 0038 05/15/20 0649 05/15/20 0845  BP: 138/60  (!) 136/56   Pulse: 60  60   Resp: 18  19   Temp: 97.7 F (36.5 C)  97.9 F (36.6 C)   TempSrc:      SpO2: 99% 96% 99% 98%  Weight:      Height:        General: Pt is alert, awake, not in acute distress Cardiovascular: RRR, S1/S2 +, no rubs, no gallops Respiratory: CTA bilaterally, no wheezing, no rhonchi Abdominal: Soft, NT, ND, bowel sounds + Extremities: 1+LLE edema, no  cyanosis   The results of significant diagnostics from this hospitalization (including imaging, microbiology, ancillary and laboratory) are listed below for reference.    Significant Diagnostic Studies: DG Chest Portable 1 View  Result Date: 05/14/2020 CLINICAL DATA:  Dyspnea EXAM: PORTABLE CHEST 1 VIEW COMPARISON:  None. FINDINGS: Two lead right subclavian ICD with lead tips overlying the right atrium and right ventricular apex. Mild cardiomegaly no pneumothorax. No pleural effusion. No overt pulmonary edema. No consolidative airspace disease. Surgical clips overlie the left lateral chest wall/left breast. IMPRESSION: Mild cardiomegaly without overt pulmonary edema. No  active pulmonary disease. Electronically Signed   By: Ilona Sorrel M.D.   On: 05/14/2020 15:20     Microbiology: Recent Results (from the past 240 hour(s))  Respiratory Panel by RT PCR (Flu A&B, Covid) - Nasopharyngeal Swab     Status: None   Collection Time: 05/14/20  6:58 PM   Specimen: Nasopharyngeal Swab  Result Value Ref Range Status   SARS Coronavirus 2 by RT PCR NEGATIVE NEGATIVE Final    Comment: (NOTE) SARS-CoV-2 target nucleic acids are NOT DETECTED.  The SARS-CoV-2 RNA is generally detectable in upper respiratoy specimens during the acute phase of infection. The lowest concentration of SARS-CoV-2 viral copies this assay can detect is 131 copies/mL. A negative result does not preclude SARS-Cov-2 infection and should not be used as the sole basis for treatment or other patient management decisions. A negative result may occur with  improper specimen collection/handling, submission of specimen other than nasopharyngeal swab, presence of viral mutation(s) within the areas targeted by this assay, and inadequate number of viral copies (<131 copies/mL). A negative result must be combined with clinical observations, patient history, and epidemiological information. The expected result is Negative.  Fact Sheet for Patients:  PinkCheek.be  Fact Sheet for Healthcare Providers:  GravelBags.it  This test is no t yet approved or cleared by the [place] FDA and  has been authorized for detection and/or diagnosis of SARS-CoV-2 by FDA under an Emergency Use Authorization (EUA). This EUA will remain  in effect (meaning this test can be used) for the duration of the COVID-19 declaration under Section 564(b)(1) of the Act, 21 U.S.C. section 360bbb-3(b)(1), unless the authorization is terminated or revoked sooner.     Influenza A by PCR NEGATIVE NEGATIVE Final   Influenza B by PCR NEGATIVE NEGATIVE Final    Comment:  (NOTE) The Xpert Xpress SARS-CoV-2/FLU/RSV assay is intended as an aid in  the diagnosis of influenza from Nasopharyngeal swab specimens and  should not be used as a sole basis for treatment. Nasal washings and  aspirates are unacceptable for Xpert Xpress SARS-CoV-2/FLU/RSV  testing.  Fact Sheet for Patients: PinkCheek.be  Fact Sheet for Healthcare Providers: GravelBags.it  This test is not yet approved or cleared by the [place] FDA and  has been authorized for detection and/or diagnosis of SARS-CoV-2 by  FDA under an Emergency Use Authorization (EUA). This EUA will remain  in effect (meaning this test can be used) for the duration of the  Covid-19 declaration under Section 564(b)(1) of the Act, 21  U.S.C. section 360bbb-3(b)(1), unless the authorization is  terminated or revoked. Performed at [facility], [address], [place], [place] [address]      Labs: Basic Metabolic Panel: Recent Labs  Lab 05/14/20 1507 05/15/20 0255  NA 138 135  K 4.4 4.3  CL 103 105  CO2 27 25  GLUCOSE 108* 132*  BUN 18 19  CREATININE 1.36* 1.16*  CALCIUM 9.5 8.7*  MG  --  2.0  PHOS  --  3.4   Liver Function Tests: Recent Labs  Lab 05/14/20 1507 05/15/20 0255  AST 23 21  ALT 20 19  ALKPHOS 50 44  BILITOT 0.4 0.5  PROT 8.1 7.0  ALBUMIN 3.9 3.3*   No results for input(s): LIPASE, AMYLASE in the last 168 hours. No results for input(s): AMMONIA in the last 168 hours. CBC: Recent Labs  Lab 05/14/20 1507 05/15/20 0255  WBC 7.5 7.0  NEUTROABS 3.9  --   HGB 13.1 10.9*  HCT 41.6 34.7*  MCV 97.2 97.2  PLT 214 206   Cardiac Enzymes: No results for input(s): CKTOTAL, CKMB, CKMBINDEX, TROPONINI in the last 168 hours. BNP: Invalid input(s): POCBNP CBG: No results for input(s): GLUCAP in the last 168 hours.  Time coordinating discharge:  36 minutes  Signed:  Orson Eva, DO [facility] Pager:  [phone] 05/15/2020, 9:07 AM

## 2022-01-05 IMAGING — US US EXTREM LOW VENOUS
1 series · 14 of 24 positions shown · non-contrast
Comparison: None.

CLINICAL DATA: Left leg swelling.

EXAM:
BILATERAL LOWER EXTREMITY VENOUS DOPPLER ULTRASOUND
TECHNIQUE: Gray-scale sonography with compression, as well as color and duplex
ultrasound, were performed to evaluate the deep venous system(s)
from the level of the common femoral vein through the popliteal and
proximal calf veins.

[Series 1: us venous img lower bilat (dvt) · portal-venous · 14 of 58 slices shown]
[im 1/58]
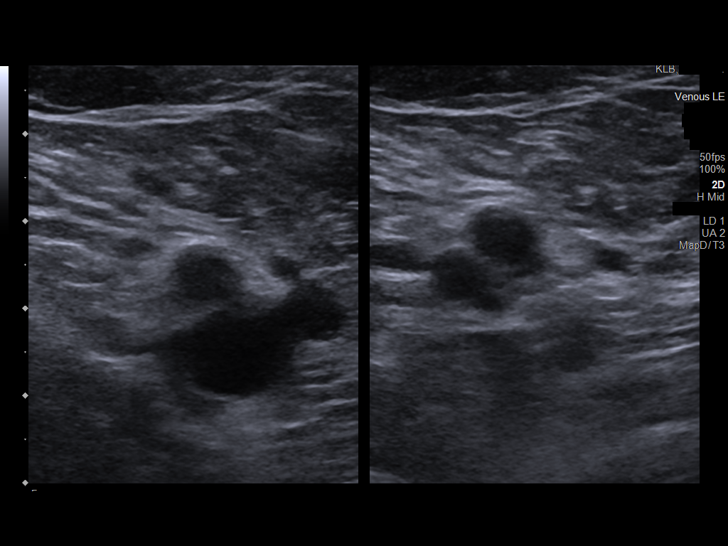
[im 5/58]
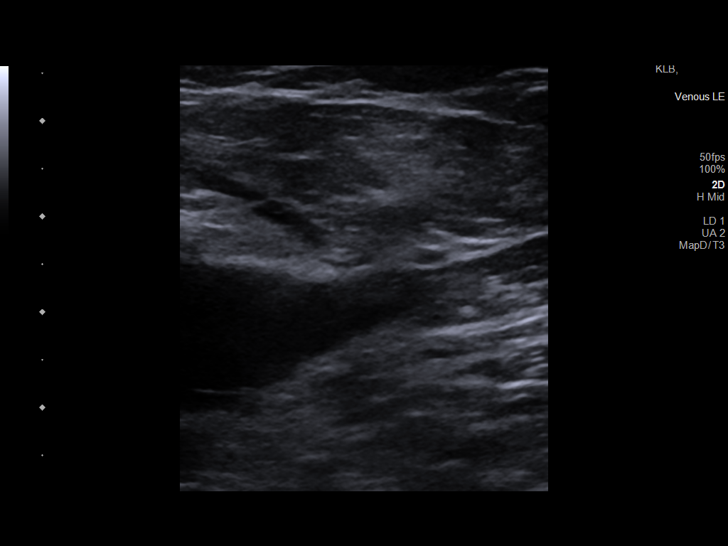
[im 10/58]
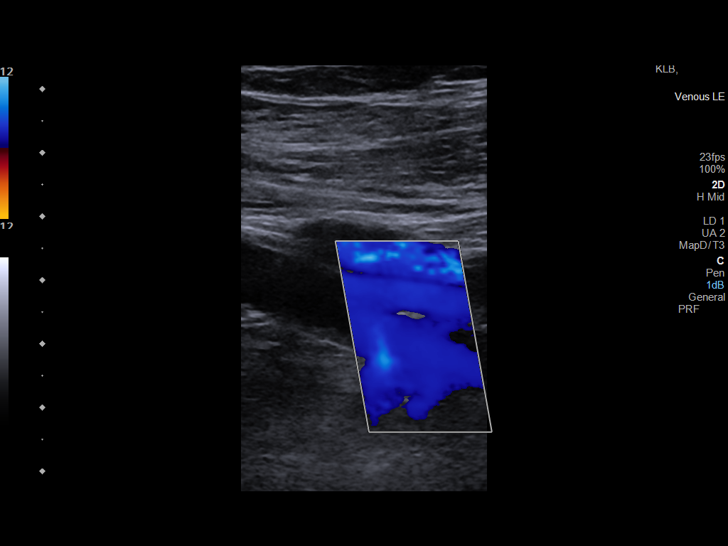
[im 15/58]
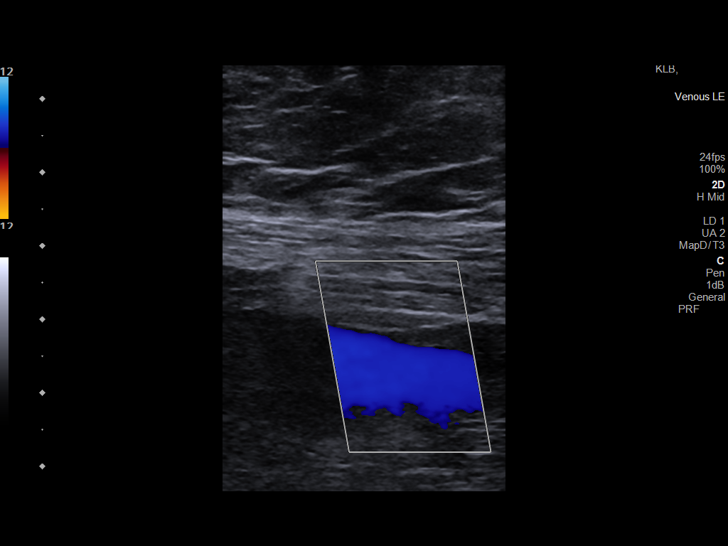
[im 18/58]
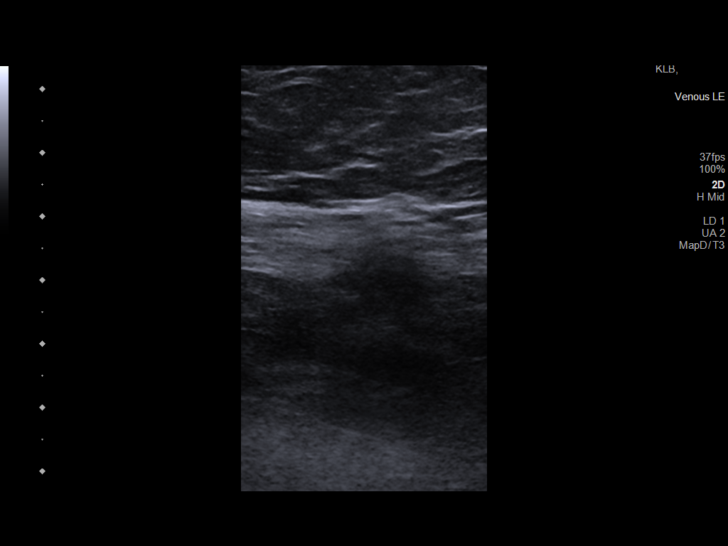
[im 23/58]
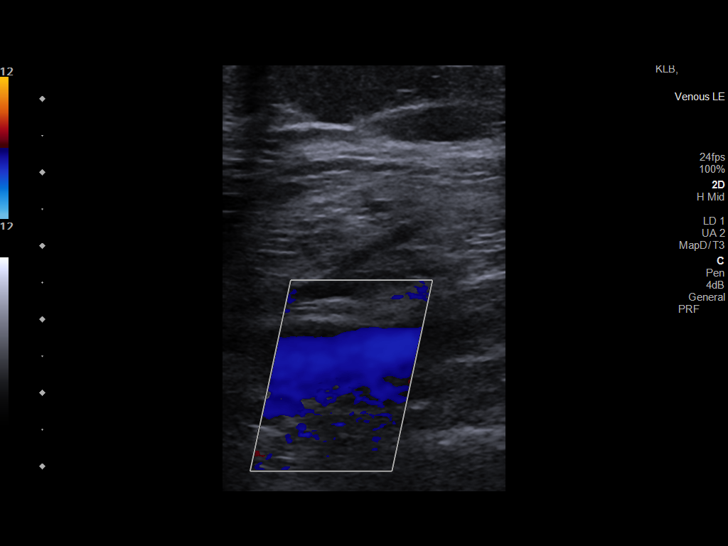
[im 28/58]
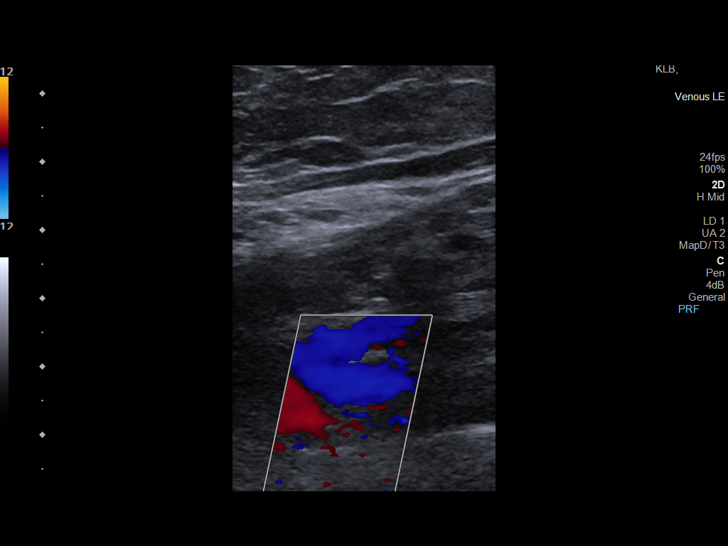
[im 30/58]
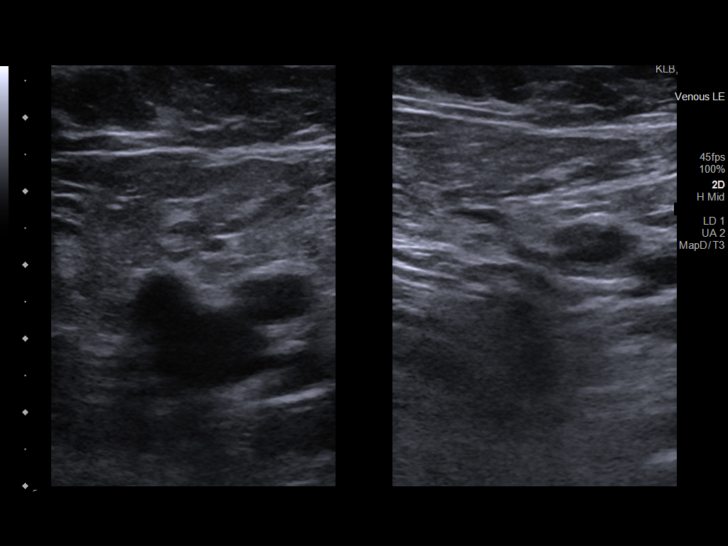
[im 35/58]
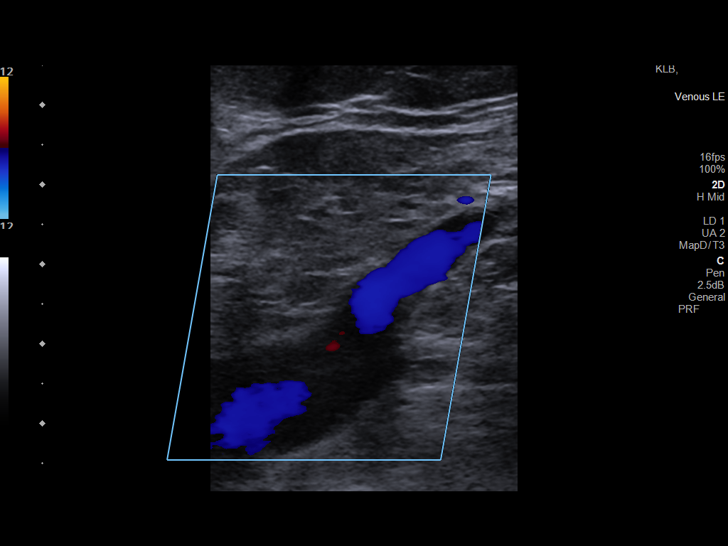
[im 40/58]
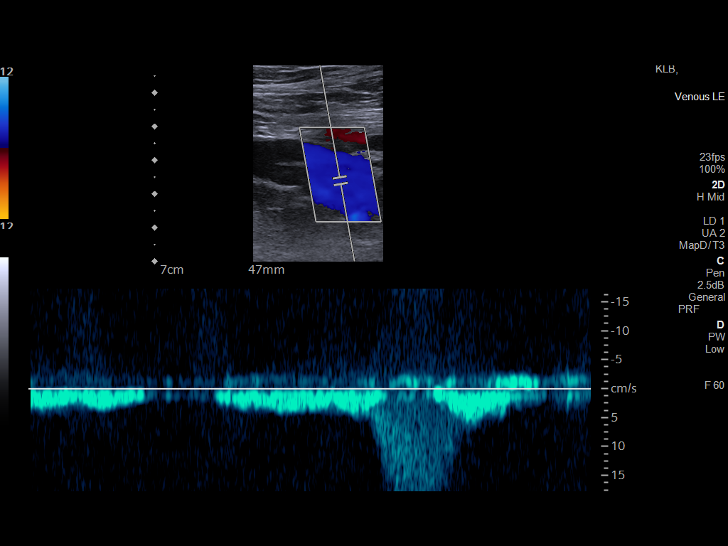
[im 45/58]
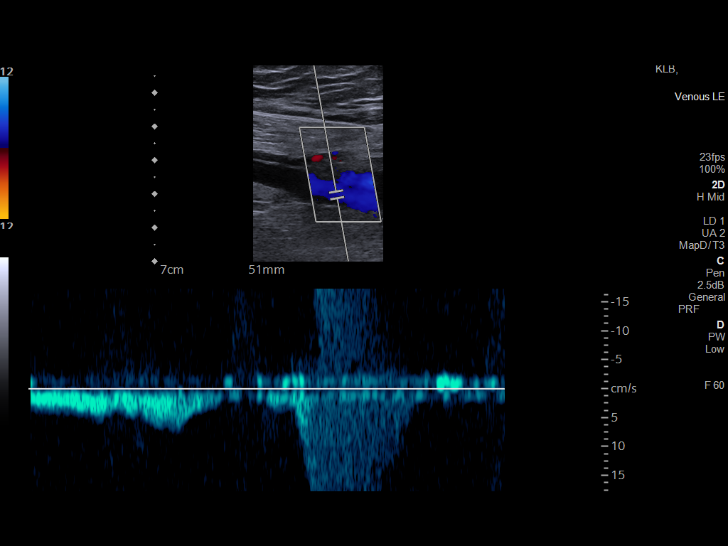
[im 48/58]
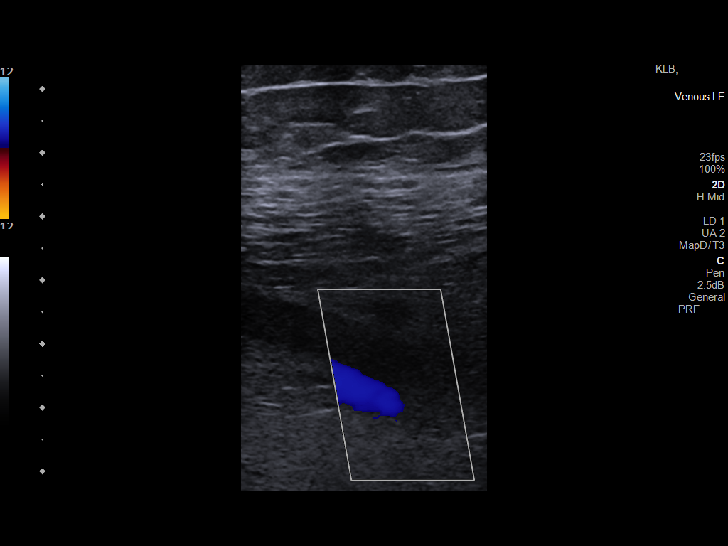
[im 53/58]
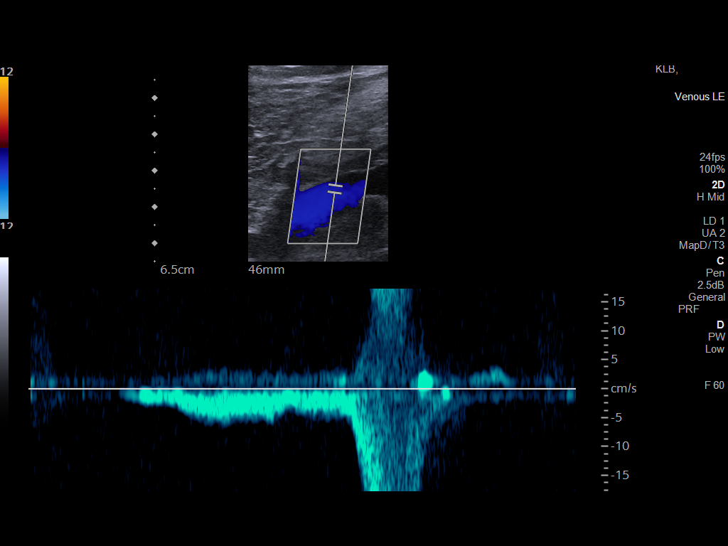
[im 58/58]
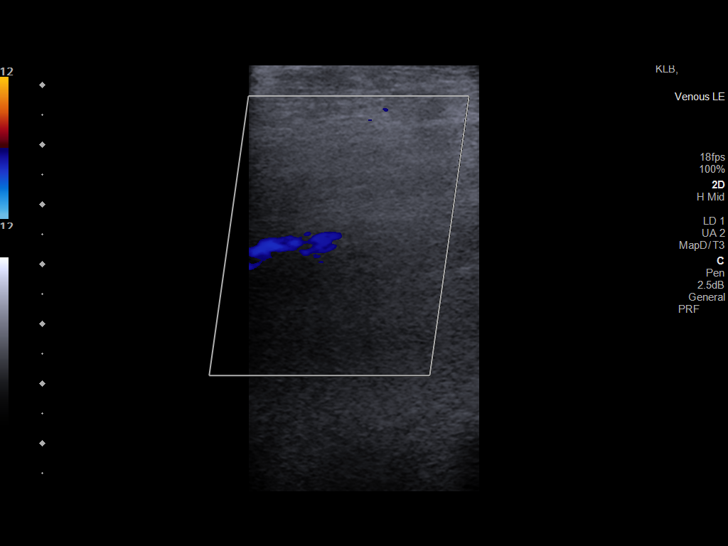

[14 of 24 positions shown; findings below may reference images not displayed]

FINDINGS: BILATERAL VENOUS

Normal compressibility of the common femoral, superficial femoral,
and popliteal veins, as well as the visualized calf veins.
Visualized portions of profunda femoral vein and great saphenous
vein unremarkable. No filling defects to suggest DVT on grayscale or
color Doppler imaging. Doppler waveforms show normal direction of
venous flow, normal respiratory plasticity and response to
augmentation.

OTHER

None.

Limitations: none
IMPRESSION: Negative for DVT in the bilateral lower extremities.
